# Patient Record
Sex: Male | Born: 2005 | Race: White | Hispanic: No | Marital: Single | Smoking: Never smoker
Health system: Southern US, Community
[De-identification: ages and names within clinical notes are randomized; demographics above are authoritative.]

## PROBLEM LIST (undated history)

## (undated) DIAGNOSIS — F338 Other recurrent depressive disorders: Secondary | ICD-10-CM

## (undated) DIAGNOSIS — J309 Allergic rhinitis, unspecified: Secondary | ICD-10-CM

---
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
## 2024-07-28 ENCOUNTER — Encounter: Payer: Self-pay | Admitting: Family Medicine
